# Patient Record
Sex: Male | Born: 1980 | Hispanic: No | Marital: Married | Smoking: Never smoker
Health system: Southern US, Community
[De-identification: ages and names within clinical notes are randomized; demographics above are authoritative.]

## PROBLEM LIST (undated history)

## (undated) DIAGNOSIS — B019 Varicella without complication: Secondary | ICD-10-CM

## (undated) DIAGNOSIS — Z9289 Personal history of other medical treatment: Secondary | ICD-10-CM

## (undated) HISTORY — DX: Varicella without complication: B01.9

## (undated) HISTORY — DX: Personal history of other medical treatment: Z92.89

---

## 2012-04-12 ENCOUNTER — Encounter: Payer: Self-pay | Admitting: Family Medicine

## 2012-04-12 ENCOUNTER — Ambulatory Visit (INDEPENDENT_AMBULATORY_CARE_PROVIDER_SITE_OTHER): Payer: BC Managed Care – PPO | Admitting: Family Medicine

## 2012-04-12 VITALS — BP 120/74 | HR 68 | Temp 98.3°F | Ht 69.5 in | Wt 197.0 lb

## 2012-04-12 DIAGNOSIS — R7301 Impaired fasting glucose: Secondary | ICD-10-CM | POA: Insufficient documentation

## 2012-04-12 DIAGNOSIS — Z0000 Encounter for general adult medical examination without abnormal findings: Secondary | ICD-10-CM | POA: Insufficient documentation

## 2012-04-12 DIAGNOSIS — E559 Vitamin D deficiency, unspecified: Secondary | ICD-10-CM | POA: Insufficient documentation

## 2012-04-12 LAB — HEPATIC FUNCTION PANEL
ALT: 44 U/L (ref 0–53)
Albumin: 4.5 g/dL (ref 3.5–5.2)
Indirect Bilirubin: 0.6 mg/dL (ref 0.0–0.9)
Total Protein: 7.1 g/dL (ref 6.0–8.3)

## 2012-04-12 LAB — LIPID PANEL
Cholesterol: 207 mg/dL — ABNORMAL HIGH (ref 0–200)
Triglycerides: 207 mg/dL — ABNORMAL HIGH (ref <150)
VLDL: 41 mg/dL — ABNORMAL HIGH (ref 0–40)

## 2012-04-12 LAB — HEMOGLOBIN A1C: Mean Plasma Glucose: 97 mg/dL (ref <117)

## 2012-04-12 LAB — TSH: TSH: 1.302 u[IU]/mL (ref 0.350–4.500)

## 2012-04-12 LAB — BASIC METABOLIC PANEL
BUN: 9 mg/dL (ref 6–23)
Chloride: 105 mEq/L (ref 96–112)
Creat: 0.61 mg/dL (ref 0.50–1.35)
Glucose, Bld: 92 mg/dL (ref 70–99)
Potassium: 4.1 mEq/L (ref 3.5–5.3)

## 2012-04-12 LAB — CBC WITH DIFFERENTIAL/PLATELET
Basophils Absolute: 0 10*3/uL (ref 0.0–0.1)
Basophils Relative: 0 % (ref 0–1)
Eosinophils Relative: 6 % — ABNORMAL HIGH (ref 0–5)
HCT: 39.6 % (ref 39.0–52.0)
Hemoglobin: 13.6 g/dL (ref 13.0–17.0)
MCHC: 34.3 g/dL (ref 30.0–36.0)
MCV: 90 fL (ref 78.0–100.0)
Monocytes Absolute: 0.6 10*3/uL (ref 0.1–1.0)
Monocytes Relative: 7 % (ref 3–12)
RDW: 13.6 % (ref 11.5–15.5)

## 2012-04-12 NOTE — Assessment & Plan Note (Signed)
Pt's PE WNL w/ exception of being overweight.  Pt declined testicular exam.  Check labs.  Anticipatory guidance provided.

## 2012-04-12 NOTE — Progress Notes (Signed)
  Subjective:    Patient ID: Jack Gutierrez, male    DOB: 07-01-1981, 31 y.o.   MRN: [id number]  HPI New to establish.  Recently moved from [place].  Hx of elevated glucose, A1C was 6.1 this fall.  Strong family hx of DM.  Hx of Vit D deficiency- 14 this fall.   Review of Systems Patient reports no vision/hearing changes, anorexia, fever ,adenopathy, persistant/recurrent hoarseness, swallowing issues, chest pain, palpitations, edema, persistant/recurrent cough, hemoptysis, dyspnea (rest,exertional, paroxysmal nocturnal), gastrointestinal  bleeding (melena, rectal bleeding), abdominal pain, excessive heart burn, GU symptoms (dysuria, hematuria, voiding/incontinence issues) syncope, focal weakness, memory loss, numbness & tingling, skin/hair/nail changes, depression, anxiety, abnormal bruising/bleeding, musculoskeletal symptoms/signs.    Objective:   Physical Exam BP 120/74  Pulse 68  Temp(Src) 98.3 F (36.8 C) (Oral)  Ht 5' 9.5" (1.765 m)  Wt 197 lb (89.359 kg)  BMI 28.67 kg/m2  SpO2 98%  General Appearance:    Alert, cooperative, no distress, appears stated age  Head:    Normocephalic, without obvious abnormality, atraumatic  Eyes:    PERRL, conjunctiva/corneas clear, EOM's intact, fundi    benign, both eyes       Ears:    Normal TM's and external ear canals, both ears  Nose:   Nares normal, septum midline, mucosa normal, no drainage   or sinus tenderness  Throat:   Lips, mucosa, and tongue normal; teeth and gums normal  Neck:   Supple, symmetrical, trachea midline, no adenopathy;       thyroid:  No enlargement/tenderness/nodules  Back:     Symmetric, no curvature, ROM normal, no CVA tenderness  Lungs:     Clear to auscultation bilaterally, respirations unlabored  Chest wall:    No tenderness or deformity  Heart:    Regular rate and rhythm, S1 and S2 normal, no murmur, rub   or gallop  Abdomen:     Soft, non-tender, bowel sounds active all four quadrants,    no masses, no  organomegaly  Genitalia:    Deferred at pt's request  Rectal:    Extremities:   Extremities normal, atraumatic, no cyanosis or edema  Pulses:   2+ and symmetric all extremities  Skin:   Skin color, texture, turgor normal, no rashes or lesions  Lymph nodes:   Cervical, supraclavicular, and axillary nodes normal  Neurologic:   CNII-XII intact. Normal strength, sensation and reflexes      throughout          Assessment & Plan:

## 2012-04-12 NOTE — Progress Notes (Signed)
Addended by: Jaleeyah Munce D on: 04/12/2012 05:02 PM   Modules accepted: Orders  

## 2012-04-12 NOTE — Patient Instructions (Signed)
We'll notify you of your lab results and determine follow up based on this Keep up the good work on healthy diet and exercise Call with any questions or concerns Welcome!  We're glad to have you! Happy [birth year] Birthday!

## 2012-04-12 NOTE — Assessment & Plan Note (Signed)
New dx.  Found on labs this fall, was not started on replacement therapy.  Will recheck labs and replete prn.

## 2012-04-12 NOTE — Assessment & Plan Note (Signed)
New dx.  Found on labs this fall.  Repeat labs due to strong family hx of diabetes.

## 2012-12-21 ENCOUNTER — Other Ambulatory Visit: Payer: Self-pay

## 2013-09-11 ENCOUNTER — Other Ambulatory Visit: Payer: Self-pay

## 2017-09-07 DIAGNOSIS — R7303 Prediabetes: Secondary | ICD-10-CM | POA: Diagnosis not present

## 2017-09-07 DIAGNOSIS — E7800 Pure hypercholesterolemia, unspecified: Secondary | ICD-10-CM | POA: Diagnosis not present

## 2017-09-07 DIAGNOSIS — Z23 Encounter for immunization: Secondary | ICD-10-CM | POA: Diagnosis not present

## 2017-09-07 DIAGNOSIS — Z0000 Encounter for general adult medical examination without abnormal findings: Secondary | ICD-10-CM | POA: Diagnosis not present

## 2017-09-07 DIAGNOSIS — E559 Vitamin D deficiency, unspecified: Secondary | ICD-10-CM | POA: Diagnosis not present

## 2018-08-15 ENCOUNTER — Emergency Department (HOSPITAL_COMMUNITY)
Admission: EM | Admit: 2018-08-15 | Discharge: 2018-08-16 | Disposition: A | Discharge status: Home / Self Care | Payer: 59 | Attending: Emergency Medicine | Admitting: Emergency Medicine

## 2018-08-15 ENCOUNTER — Other Ambulatory Visit: Payer: Self-pay

## 2018-08-15 ENCOUNTER — Encounter (HOSPITAL_COMMUNITY): Payer: Self-pay

## 2018-08-15 DIAGNOSIS — Y9301 Activity, walking, marching and hiking: Secondary | ICD-10-CM | POA: Insufficient documentation

## 2018-08-15 DIAGNOSIS — W19XXXA Unspecified fall, initial encounter: Secondary | ICD-10-CM

## 2018-08-15 DIAGNOSIS — Y999 Unspecified external cause status: Secondary | ICD-10-CM | POA: Insufficient documentation

## 2018-08-15 DIAGNOSIS — S62347A Nondisplaced fracture of base of fifth metacarpal bone. left hand, initial encounter for closed fracture: Secondary | ICD-10-CM

## 2018-08-15 DIAGNOSIS — S62346A Nondisplaced fracture of base of fifth metacarpal bone, right hand, initial encounter for closed fracture: Secondary | ICD-10-CM | POA: Insufficient documentation

## 2018-08-15 DIAGNOSIS — Z79899 Other long term (current) drug therapy: Secondary | ICD-10-CM | POA: Insufficient documentation

## 2018-08-15 DIAGNOSIS — Y92009 Unspecified place in unspecified non-institutional (private) residence as the place of occurrence of the external cause: Secondary | ICD-10-CM | POA: Insufficient documentation

## 2018-08-15 DIAGNOSIS — W109XXA Fall (on) (from) unspecified stairs and steps, initial encounter: Secondary | ICD-10-CM | POA: Insufficient documentation

## 2018-08-15 NOTE — ED Notes (Signed)
No answer x1

## 2018-08-15 NOTE — ED Triage Notes (Signed)
Pt fell at home broke the fall with left hand.  Now having wrist pain.

## 2018-08-16 ENCOUNTER — Emergency Department (HOSPITAL_COMMUNITY): Payer: 59

## 2018-08-16 NOTE — Discharge Instructions (Addendum)
X-ray showed that you broke your 5th metacarpal. You will need to wear this splint until you are seen by the hand providers. Please call Dr. Merrilee Seashore office in the morning to schedule an appointment.  You may use Tylenol and/or Ibuprofen/Naproxen for pain relief.  I'm sorry that this happened to you today. I wish you a speedy recovery!

## 2018-08-16 NOTE — ED Provider Notes (Signed)
[facility] EMERGENCY DEPARTMENT Provider Note  CSN: [id number] Arrival date & time: 08/15/18  2311  History   Chief Complaint Chief Complaint  Patient presents with  . Fall  . Wrist Pain    left   HPI Jack Gutierrez is a 37 y.o. male with no significant medical history who presented to the ED for wrist pain following a fall. He reports that he slipped walking down the steps and broke his fall with his wrist. Denies decreased ROM, weakness or paresthesias. Denies preceding symptoms to fall such as vision changes, chest pain, SOB, palpitations or neuro complaints.  Hand Injury   The incident occurred 1 to 2 hours ago. The incident occurred at home. The injury mechanism was a fall. The pain is present in the left hand. The quality of the pain is described as aching and throbbing. The pain is at a severity of 8/10. The pain is moderate. The pain has been constant since the incident. The symptoms are aggravated by movement. He has tried nothing for the symptoms.   Past Medical History:  Diagnosis Date  . Chicken pox   . History of positive PPD    negative chest x-ray    Patient Active Problem List   Diagnosis Date Noted  . Vitamin D deficiency 04/12/2012  . Elevated fasting glucose 04/12/2012  . Routine general medical examination at a health care facility 04/12/2012    History reviewed. No pertinent surgical history.      Home Medications    Prior to Admission medications   Medication Sig Start Date End Date Taking? Authorizing Provider  calcium citrate-vitamin D (CITRACAL+D) 315-200 MG-UNIT per tablet Take 1 tablet by mouth daily.    [provider]  multivitamin-iron-minerals-folic acid (CENTRUM) chewable tablet Chew 1 tablet by mouth daily.    [provider]    Family History Family History  Problem Relation Age of Onset  . Diabetes Mother     Social History Social History   Tobacco Use  . Smoking status: Never Smoker  .  Smokeless tobacco: Never Used  Substance Use Topics  . Alcohol use: Yes    Comment: occasionally  . Drug use: No     Allergies   Patient has no known allergies.   Review of Systems Review of Systems  Constitutional: Negative.   Musculoskeletal: Positive for arthralgias and joint swelling.  Skin: Negative.   Neurological: Negative for weakness and numbness.  Hematological: Negative.    Physical Exam Updated Vital Signs BP 120/78 (BP Location: Right Arm)   Pulse (!) 55   Temp 98.5 F (36.9 C) (Oral)   Resp 16   SpO2 100%   Physical Exam  Constitutional: Vital signs are normal. He appears well-developed and well-nourished. He is cooperative.  Cardiovascular:  Pulses:      Radial pulses are 2+ on the right side, and 2+ on the left side.  Musculoskeletal:       Left hand: He exhibits tenderness and bony tenderness. He exhibits normal range of motion.  Swelling along the lateral aspect of the left hand with bony tenderness of the 4th and 5th MCPs. Full ROM of fingers and wrists bilaterally with 5/5 strength.  Neurological: He is alert. He has normal strength. No sensory deficit. He exhibits normal muscle tone.  Reflex Scores:      Bicep reflexes are 2+ on the right side and 2+ on the left side.      Brachioradialis reflexes are 2+ on the right  side and 2+ on the left side. Skin: Skin is warm and intact. Capillary refill takes less than 2 seconds. No pallor.  Nursing note and vitals reviewed.  ED Treatments / Results  Labs (all labs ordered are listed, but only abnormal results are displayed) Labs Reviewed - No data to display  EKG None  Radiology Dg Hand Complete Left  Result Date: 08/16/2018 CLINICAL DATA:  Fall on L2 stretched hand. EXAM: LEFT HAND - COMPLETE 3+ VIEW COMPARISON:  None. FINDINGS: There is a volarly angulated fracture of the distal fifth metacarpal of the left hand with associated soft tissue swelling. No other fracture. IMPRESSION: Anteriorly  angulated fracture of the distal fifth metacarpal. Electronically Signed   By: Deatra Robinson M.D.   On: 08/16/2018 00:48    Procedures Procedures (including critical care time)  Medications Ordered in ED Medications - No data to display   Initial Impression / Assessment and Plan / ED Course  Triage vital signs and the nursing notes have been reviewed.  Pertinent labs & imaging results that were available during care of the patient were reviewed and considered in medical decision making (see chart for details).  Patient presents to the ED after a mechanical fall where he fell on his left hand. On exam, he has swelling and bony tenderness over the 4th and 5th MCPs which is concerning for fracture especially given the nature of the injury. His compartments are soft, neurovascular function is intact and he has full ROM which is all reassuring. Imaging ordered to further evaluate injury.   Clinical Course as of Aug 16 100  Fri Aug 16, 2018  1610 X-ray shows fracture of 5th metacarpal. No displacement.   [GM]    Clinical Course User Index [GM] Mortis, Sharyon Medicus, PA-C   Final Clinical Impressions(s) / ED Diagnoses  1. Left 5th Metacarpal Fracture. Ulnar gutter splint applied. Referral to hand surgeon for follow-up. Patient decline pain medication today.  Dispo: Home. After thorough clinical evaluation, this patient is determined to be medically stable and can be safely discharged with the previously mentioned treatment and/or outpatient follow-up/referral(s). At this time, there are no other apparent medical conditions that require further screening, evaluation or treatment.   Final diagnoses:  Closed nondisplaced fracture of base of fifth metacarpal bone of left hand, initial encounter    ED Discharge Orders    None        Windy Carina, PA-C 08/16/18 0103    Gilda Crease, MD 08/16/18 0730

## 2018-08-16 NOTE — Progress Notes (Signed)
Orthopedic Tech Progress Note Patient Details:  Jack Gutierrez [birth year] [id number]  Ortho Devices Type of Ortho Device: Ulna gutter splint Ortho Device/Splint Location: lue 5th finger Ortho Device/Splint Interventions: Ordered, Application, Adjustment   Post Interventions Patient Tolerated: Well Instructions Provided: Care of device, Adjustment of device   Trinna Post 08/16/2018, 1:37 AM

## 2019-12-12 IMAGING — CR DG HAND COMPLETE 3+V*L*
3 series · 3 of 3 positions shown · non-contrast
Comparison: None.

CLINICAL DATA: Fall on L2 stretched hand.

EXAM:
LEFT HAND - COMPLETE 3+ VIEW

[wrist pa]
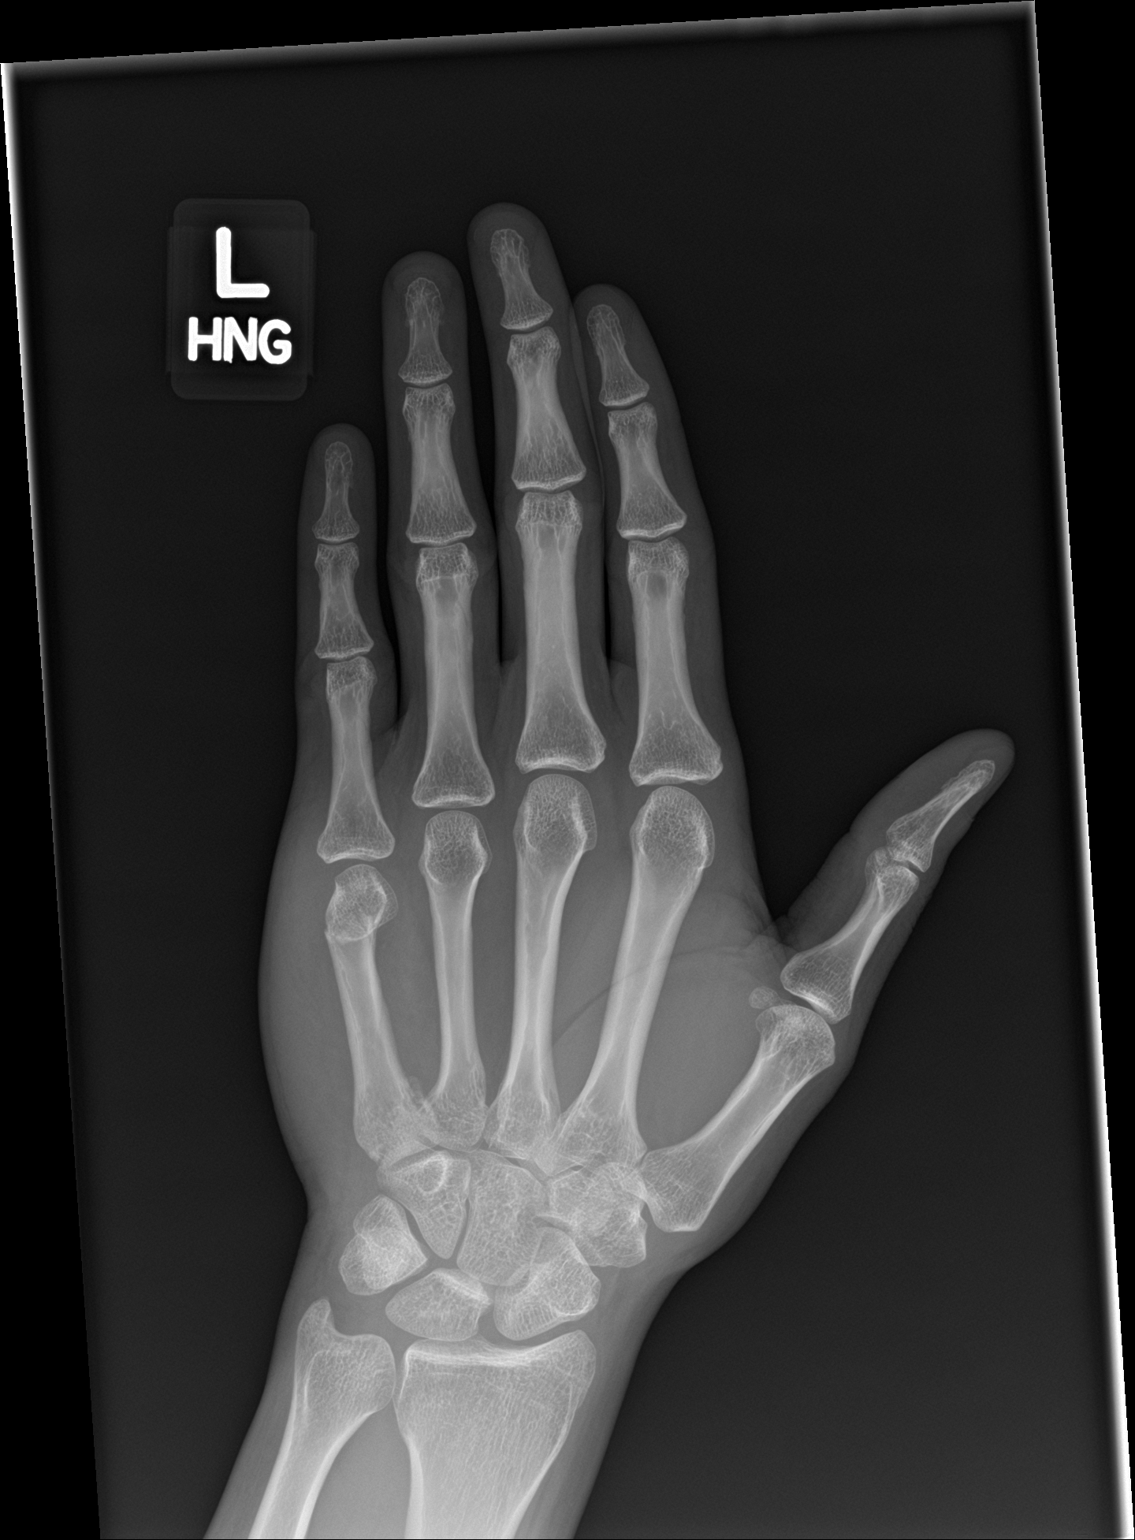

[wrist obl]
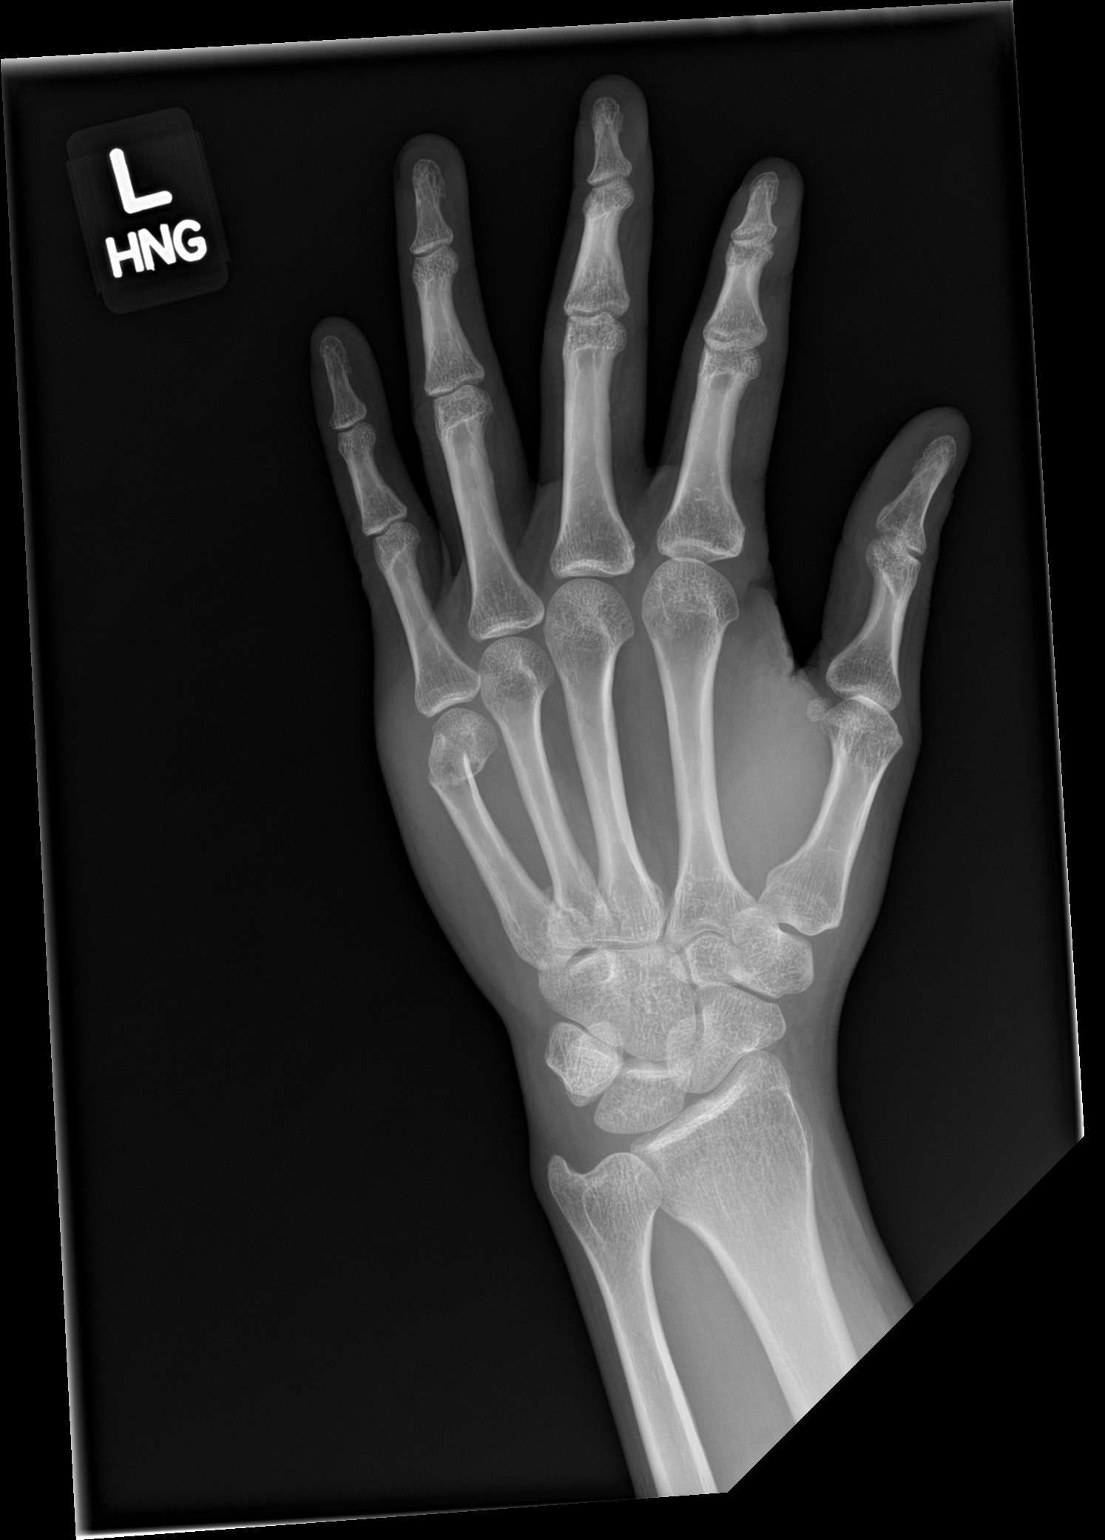

[wrist lat]
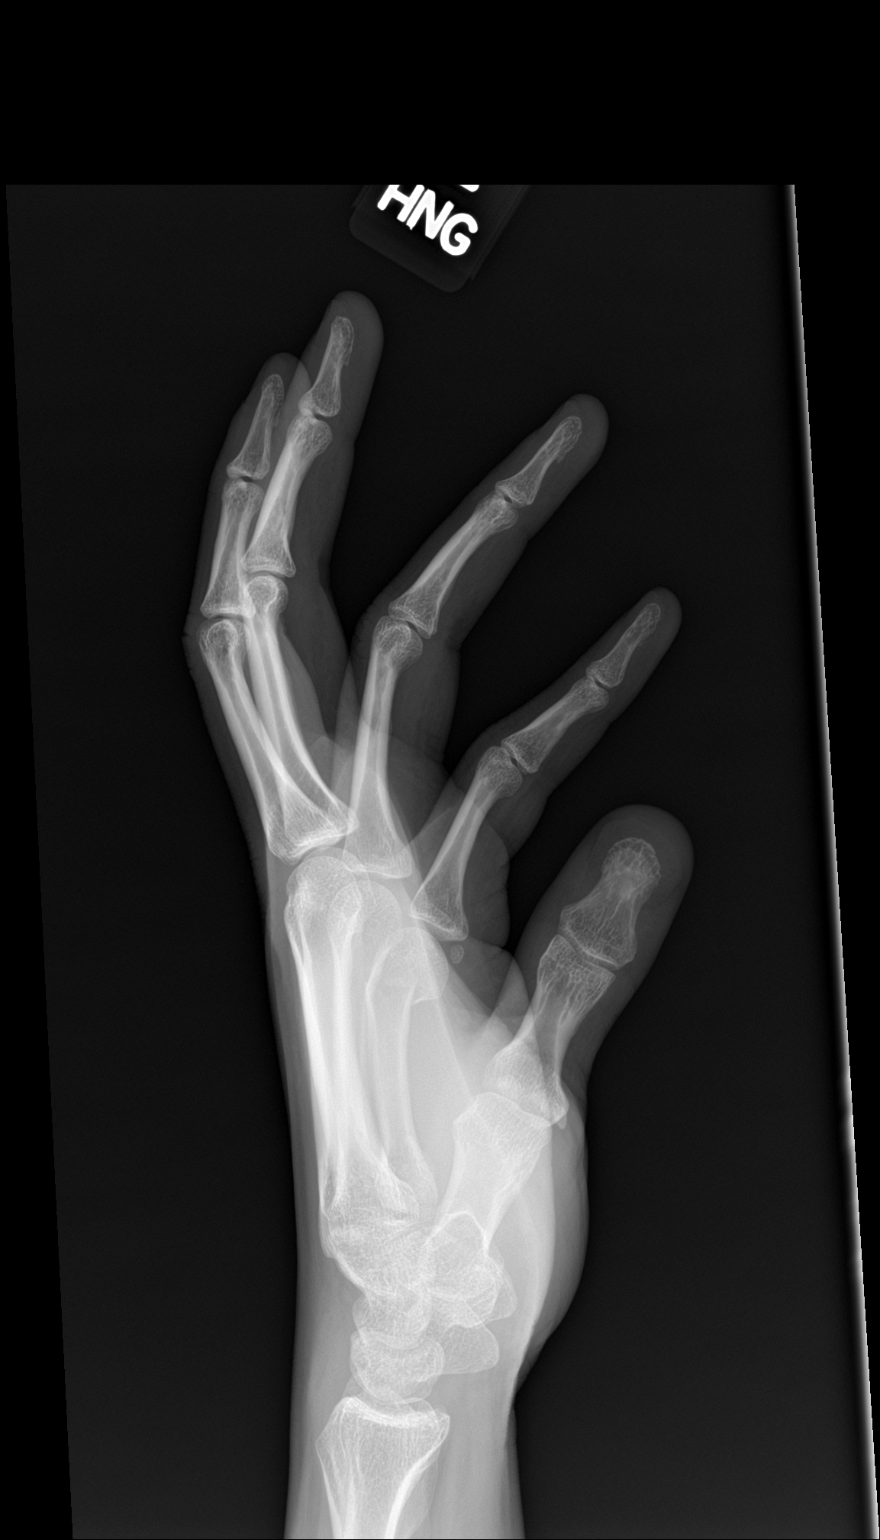

[3 of 3 positions shown; findings below may reference images not displayed]

FINDINGS: There is a volarly angulated fracture of the distal fifth metacarpal
of the left hand with associated soft tissue swelling. No other
fracture.
IMPRESSION: Anteriorly angulated fracture of the distal fifth metacarpal.

## 2020-10-07 DIAGNOSIS — Z0000 Encounter for general adult medical examination without abnormal findings: Secondary | ICD-10-CM | POA: Diagnosis not present

## 2020-10-07 DIAGNOSIS — R7303 Prediabetes: Secondary | ICD-10-CM | POA: Diagnosis not present

## 2020-10-07 DIAGNOSIS — Z23 Encounter for immunization: Secondary | ICD-10-CM | POA: Diagnosis not present

## 2020-10-07 DIAGNOSIS — E559 Vitamin D deficiency, unspecified: Secondary | ICD-10-CM | POA: Diagnosis not present

## 2020-10-07 DIAGNOSIS — R635 Abnormal weight gain: Secondary | ICD-10-CM | POA: Diagnosis not present

## 2020-10-07 DIAGNOSIS — E7800 Pure hypercholesterolemia, unspecified: Secondary | ICD-10-CM | POA: Diagnosis not present

## 2021-08-09 ENCOUNTER — Ambulatory Visit (INDEPENDENT_AMBULATORY_CARE_PROVIDER_SITE_OTHER): Payer: 59 | Admitting: Internal Medicine

## 2022-06-23 ENCOUNTER — Other Ambulatory Visit: Payer: Self-pay | Admitting: Family Medicine

## 2022-06-23 ENCOUNTER — Ambulatory Visit
Admission: RE | Admit: 2022-06-23 | Discharge: 2022-06-23 | Disposition: A | Discharge status: Home / Self Care | Payer: Managed Care, Other (non HMO) | Source: Ambulatory Visit | Attending: Family Medicine | Admitting: Family Medicine

## 2022-06-23 DIAGNOSIS — Z111 Encounter for screening for respiratory tuberculosis: Secondary | ICD-10-CM

## 2024-08-06 DIAGNOSIS — Z0000 Encounter for general adult medical examination without abnormal findings: Secondary | ICD-10-CM | POA: Diagnosis not present

## 2024-08-06 DIAGNOSIS — Z23 Encounter for immunization: Secondary | ICD-10-CM | POA: Diagnosis not present

## 2024-08-06 DIAGNOSIS — E559 Vitamin D deficiency, unspecified: Secondary | ICD-10-CM | POA: Diagnosis not present
